# Patient Record
Sex: Male | Born: 1980 | Race: Black or African American | Hispanic: No | Marital: Single | Smoking: Never smoker
Health system: Southern US, Community
[De-identification: ages and names within clinical notes are randomized; demographics above are authoritative.]

---

## 2003-01-14 ENCOUNTER — Emergency Department (HOSPITAL_COMMUNITY): Admission: EM | Admit: 2003-01-14 | Discharge: 2003-01-14 | Payer: Self-pay | Admitting: Emergency Medicine

## 2003-01-14 ENCOUNTER — Encounter: Payer: Self-pay | Admitting: Emergency Medicine

## 2004-12-23 ENCOUNTER — Emergency Department (HOSPITAL_COMMUNITY): Admission: EM | Admit: 2004-12-23 | Discharge: 2004-12-23 | Payer: Self-pay | Admitting: Emergency Medicine

## 2005-10-05 ENCOUNTER — Emergency Department (HOSPITAL_COMMUNITY): Admission: EM | Admit: 2005-10-05 | Discharge: 2005-10-05 | Payer: Self-pay | Admitting: Emergency Medicine

## 2005-10-23 ENCOUNTER — Ambulatory Visit: Payer: Self-pay | Admitting: Nurse Practitioner

## 2006-01-09 ENCOUNTER — Encounter: Admission: RE | Admit: 2006-01-09 | Discharge: 2006-01-09 | Payer: Self-pay | Admitting: Neurology

## 2006-03-22 ENCOUNTER — Ambulatory Visit: Payer: Self-pay | Admitting: Nurse Practitioner

## 2006-08-02 ENCOUNTER — Ambulatory Visit: Payer: Self-pay | Admitting: Nurse Practitioner

## 2008-03-03 ENCOUNTER — Ambulatory Visit (HOSPITAL_COMMUNITY): Payer: Self-pay | Admitting: Psychiatry

## 2008-05-06 ENCOUNTER — Ambulatory Visit (HOSPITAL_COMMUNITY): Payer: Self-pay | Admitting: Psychiatry

## 2008-11-05 ENCOUNTER — Ambulatory Visit (HOSPITAL_COMMUNITY): Payer: Self-pay | Admitting: Psychiatry

## 2009-01-13 ENCOUNTER — Ambulatory Visit (HOSPITAL_COMMUNITY): Payer: Self-pay | Admitting: Psychiatry

## 2009-12-12 ENCOUNTER — Emergency Department (HOSPITAL_BASED_OUTPATIENT_CLINIC_OR_DEPARTMENT_OTHER): Admission: EM | Admit: 2009-12-12 | Discharge: 2009-12-13 | Payer: Self-pay | Admitting: Emergency Medicine

## 2009-12-13 ENCOUNTER — Ambulatory Visit: Payer: Self-pay | Admitting: Radiology

## 2010-07-06 IMAGING — CR DG CHEST 2V
2 series · 2 of 2 positions shown · non-contrast
Comparison: None.

CLINICAL DATA: Dizziness.  Near-syncope.

CHEST - 2 VIEW

[w chest pa]
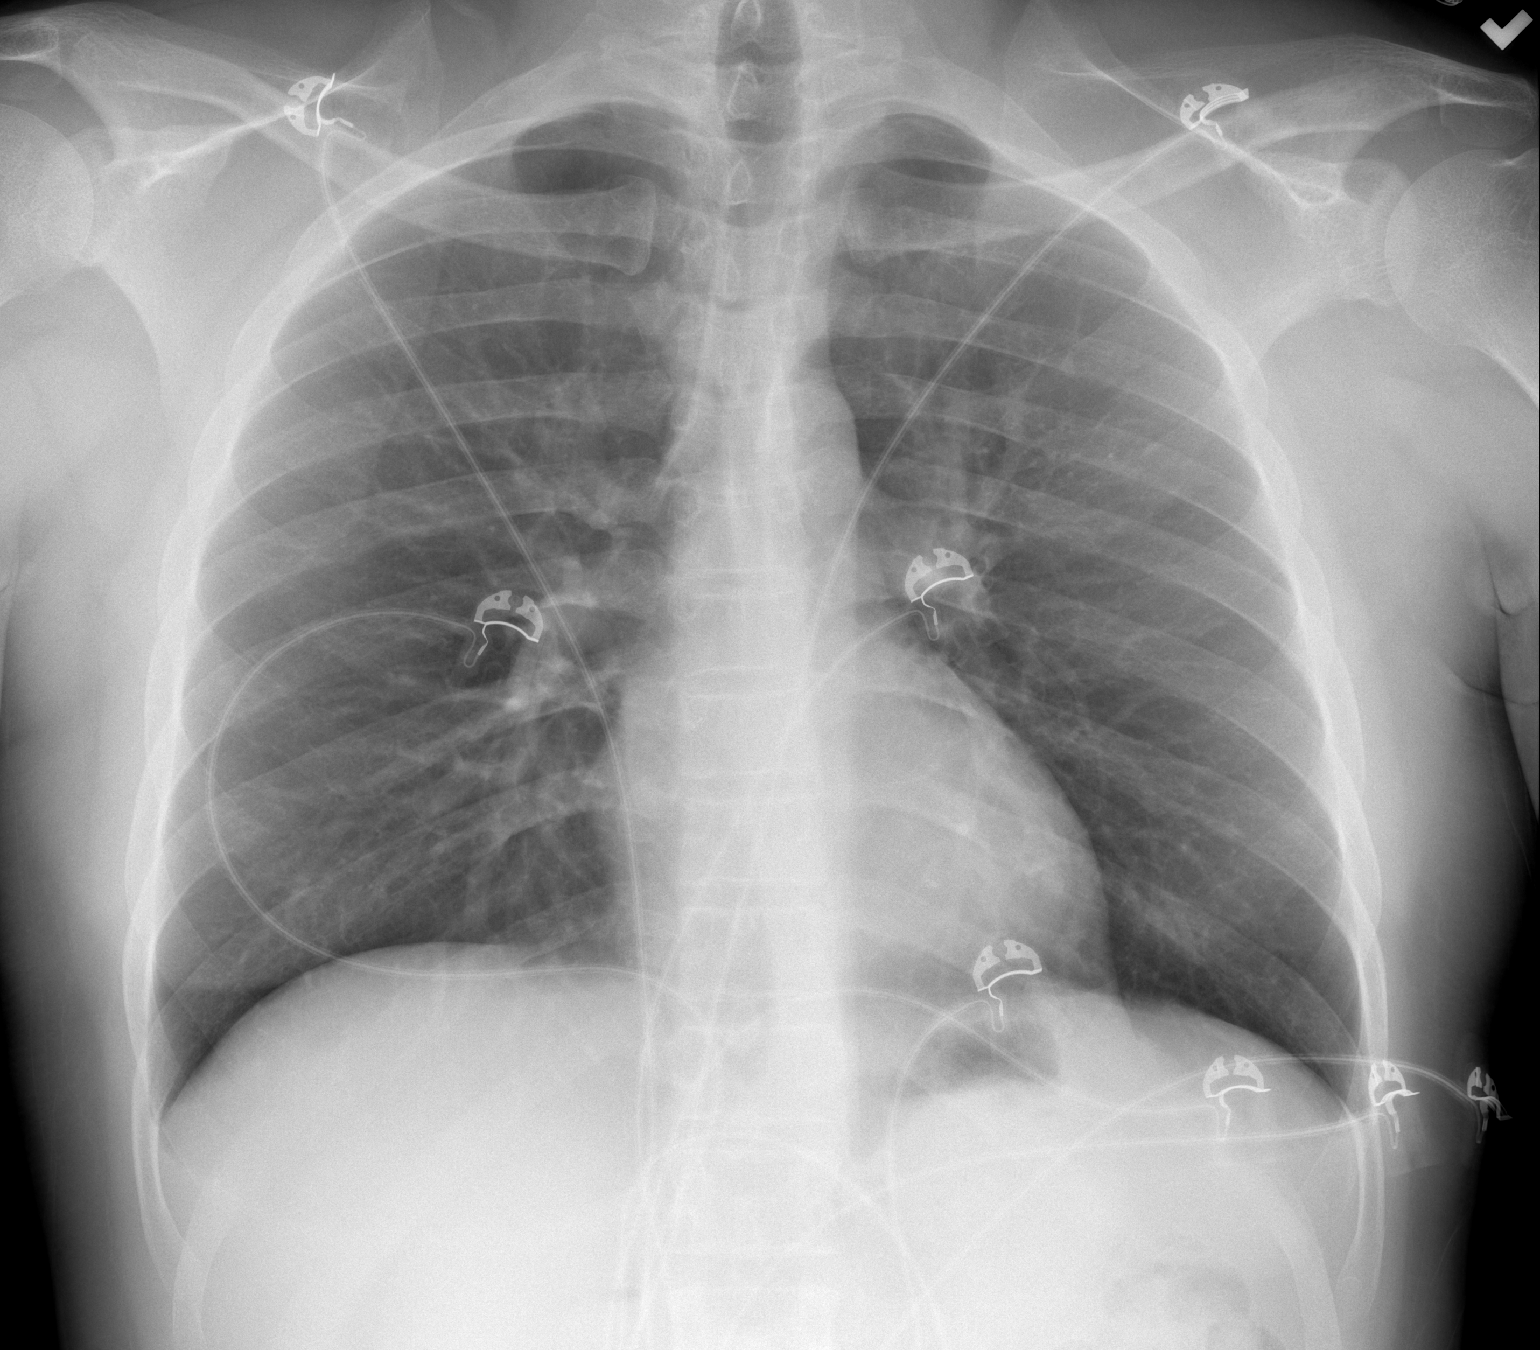

[w chest lat]
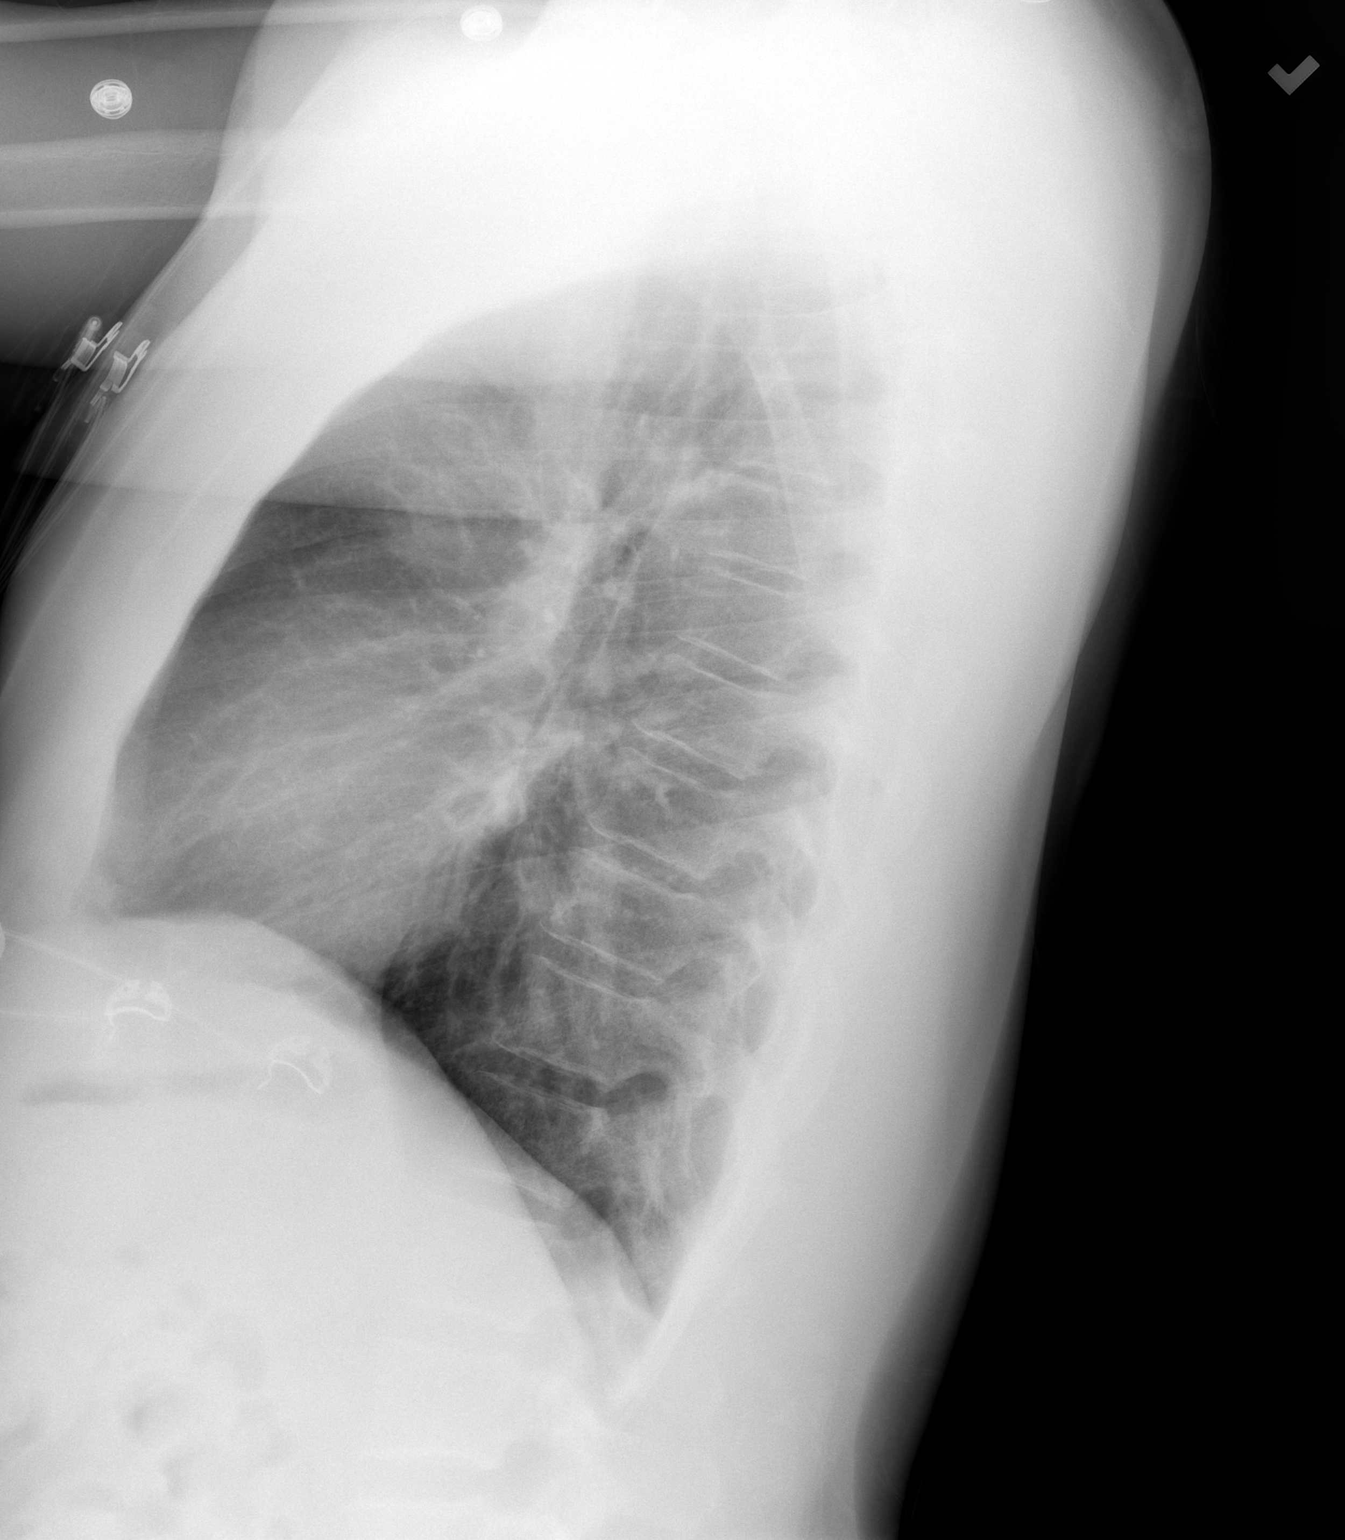

[2 of 2 positions shown; findings below may reference images not displayed]

FINDINGS: The cardiopericardial silhouette is within normal limits
for size.  The lungs are clear.  The visualized soft tissues and
bony thorax are unremarkable.
IMPRESSION: Negative chest.

## 2011-01-12 LAB — CBC
HCT: 44.8 % (ref 39.0–52.0)
Hemoglobin: 15.2 g/dL (ref 13.0–17.0)
MCHC: 34 g/dL (ref 30.0–36.0)
MCV: 88.6 fL (ref 78.0–100.0)
Platelets: 278 10*3/uL (ref 150–400)
RBC: 5.05 MIL/uL (ref 4.22–5.81)
RDW: 12.5 % (ref 11.5–15.5)
WBC: 7.9 10*3/uL (ref 4.0–10.5)

## 2011-01-12 LAB — BASIC METABOLIC PANEL
BUN: 11 mg/dL (ref 6–23)
Chloride: 103 mEq/L (ref 96–112)
Creatinine, Ser: 1.3 mg/dL (ref 0.4–1.5)
Glucose, Bld: 101 mg/dL — ABNORMAL HIGH (ref 70–99)
Potassium: 4.1 mEq/L (ref 3.5–5.1)

## 2011-01-12 LAB — DIFFERENTIAL
Eosinophils Absolute: 0 10*3/uL (ref 0.0–0.7)
Eosinophils Relative: 0 % (ref 0–5)
Lymphs Abs: 1.7 10*3/uL (ref 0.7–4.0)
Monocytes Absolute: 0.7 10*3/uL (ref 0.1–1.0)
Monocytes Relative: 9 % (ref 3–12)
Neutrophils Relative %: 68 % (ref 43–77)

## 2017-06-01 ENCOUNTER — Ambulatory Visit (HOSPITAL_COMMUNITY)
Admission: EM | Admit: 2017-06-01 | Discharge: 2017-06-01 | Disposition: A | Discharge status: Nursing Home (Includes ICF) | Payer: Self-pay

## 2017-06-01 ENCOUNTER — Emergency Department (HOSPITAL_COMMUNITY)
Admission: EM | Admit: 2017-06-01 | Discharge: 2017-06-01 | Disposition: A | Discharge status: Home / Self Care | Payer: Medicaid Other | Attending: Emergency Medicine | Admitting: Emergency Medicine

## 2017-06-01 ENCOUNTER — Encounter (HOSPITAL_COMMUNITY): Payer: Self-pay | Admitting: Emergency Medicine

## 2017-06-01 DIAGNOSIS — T63441A Toxic effect of venom of bees, accidental (unintentional), initial encounter: Secondary | ICD-10-CM | POA: Insufficient documentation

## 2017-06-01 DIAGNOSIS — R2231 Localized swelling, mass and lump, right upper limb: Secondary | ICD-10-CM | POA: Diagnosis not present

## 2017-06-01 MED ORDER — METHYLPREDNISOLONE SODIUM SUCC 125 MG IJ SOLR
125.0000 mg | Freq: Once | INTRAMUSCULAR | Status: AC
  Administered 2017-06-01: 125 mg via INTRAMUSCULAR
  Filled 2017-06-01: qty 2

## 2017-06-01 NOTE — ED Notes (Signed)
Declined W/C at D/C and was escorted to lobby by RN. 

## 2017-06-01 NOTE — ED Provider Notes (Signed)
  [facility]-EMERGENCY DEPT Provider Note   CSN: [id number] Arrival date & time: 06/01/17  1518     History   Chief Complaint Chief Complaint  Patient presents with  . Allergic Reaction    HPI Calvin Rogers is a 36 y.o. male.  The history is provided by the patient. No language interpreter was used.  Allergic Reaction  Presenting symptoms: no difficulty breathing   Severity:  Moderate Duration:  1 day Prior allergic episodes:  No prior episodes Context: insect bite/sting   Relieved by:  Nothing Worsened by:  Nothing Ineffective treatments:  None tried  Pt complains of swelling to his right hand after being stung by a wasp. History reviewed. No pertinent past medical history.  There are no active problems to display for this patient.   History reviewed. No pertinent surgical history.     Home Medications    Prior to Admission medications   Not on File    Family History History reviewed. No pertinent family history.  Social History Social History  Substance Use Topics  . Smoking status: Never Smoker  . Smokeless tobacco: Never Used  . Alcohol use No     Allergies   Patient has no allergy information on record.   Review of Systems Review of Systems  All other systems reviewed and are negative.    Physical Exam Updated Vital Signs BP (!) 141/85 (BP Location: Left Arm)   Pulse 86   Temp 98.6 F (37 C) (Oral)   Resp 18   SpO2 97%   Physical Exam  Constitutional: He appears well-developed and well-nourished.  HENT:  Head: Normocephalic.  Musculoskeletal: He exhibits tenderness.  Swollen  Left hand,  Erythema knuckles,  From  nv and ns intact,  Good color.  Neurological: He is alert.  Skin: Skin is warm. There is erythema.  Psychiatric: He has a normal mood and affect.  Nursing note and vitals reviewed.    ED Treatments / Results  Labs (all labs ordered are listed, but only abnormal results are displayed) Labs Reviewed - No data to  display  EKG  EKG Interpretation None       Radiology No results found.  Procedures Procedures (including critical care time)  Medications Ordered in ED Medications  methylPREDNISolone sodium succinate (SOLU-MEDROL) 125 mg/2 mL injection 125 mg (125 mg Intramuscular Given 06/01/17 1607)     Initial Impression / Assessment and Plan / ED Course  I have reviewed the triage vital signs and the nursing notes.  Pertinent labs & imaging results that were available during my care of the patient were reviewed by me and considered in my medical decision making (see chart for details).     (no sign of compartment syndrome.  Local swelling only)  Final Clinical Impressions(s) / ED Diagnoses   Final diagnoses:  Bee sting, accidental or unintentional, initial encounter    New Prescriptions New Prescriptions   No medications on file  An After Visit Summary was printed and given to the patient.   Elson AreasSofia, Madason Rauls K, [place]PA-C 06/01/17 1647    Doug SouJacubowitz, Sam, MD 06/01/17 [phone]

## 2017-06-01 NOTE — Discharge Instructions (Signed)
Benadryl every 4 hours for itching,  Elevate hand above heart,  hand above elbow.  Ice to area of swelling. Return for recheck if swelling persist

## 2017-06-01 NOTE — ED Triage Notes (Signed)
Pt to ER for evaluation of wasp sting to right hand yesterday. Presents today for swelling, redness, and warmth to right hand.

## 2024-05-06 ENCOUNTER — Other Ambulatory Visit: Payer: Self-pay

## 2024-05-06 ENCOUNTER — Emergency Department (HOSPITAL_BASED_OUTPATIENT_CLINIC_OR_DEPARTMENT_OTHER)
Admission: EM | Admit: 2024-05-06 | Discharge: 2024-05-07 | Disposition: A | Discharge status: Home / Self Care | Payer: Self-pay | Attending: Emergency Medicine | Admitting: Emergency Medicine

## 2024-05-06 ENCOUNTER — Encounter (HOSPITAL_BASED_OUTPATIENT_CLINIC_OR_DEPARTMENT_OTHER): Payer: Self-pay

## 2024-05-06 DIAGNOSIS — Z79899 Other long term (current) drug therapy: Secondary | ICD-10-CM | POA: Diagnosis not present

## 2024-05-06 DIAGNOSIS — I10 Essential (primary) hypertension: Secondary | ICD-10-CM | POA: Diagnosis present

## 2024-05-06 DIAGNOSIS — R030 Elevated blood-pressure reading, without diagnosis of hypertension: Secondary | ICD-10-CM

## 2024-05-06 NOTE — ED Triage Notes (Signed)
 Pt POV for high blood pressures today. Diagnosed with HTN about 3 weeks ago. Prescribed lisinopril/ hydrochlorothiazide once daily, took twice today. Denies CP, HA, SOB.

## 2024-05-07 NOTE — ED Provider Notes (Signed)
 [place] EMERGENCY DEPARTMENT AT [facility] Provider Note  CSN: [id number] Arrival date & time: 05/06/24 2334  Chief Complaint(s) Hypertension  HPI Calvin Rogers is a 43 y.o. male with a past medical history listed below including hypertension here for elevated blood pressure readings up to the systolics of 160s.  Patient has been compliant with his medication.  He was asymptomatic and denied having any headache, chest pain, shortness of breath.  Mother who is his primary caregiver give the patient another dose of his blood pressure medicine in the afternoon and when the glass pressure was still elevated around 11, they decided to come in for evaluation.  The history is provided by the patient.    Past Medical History History reviewed. No pertinent past medical history. There are no active problems to display for this patient.  Home Medication(s) Prior to Admission medications   Medication Sig Start Date End Date Taking? Authorizing Provider  lisinopril-hydrochlorothiazide (ZESTORETIC) 10-12.5 MG tablet Take 1 tablet by mouth daily.   Yes [provider]                                                                                                                                    Allergies Patient has no allergy information on record.  Review of Systems Review of Systems As noted in HPI  Physical Exam Vital Signs  I have reviewed the triage vital signs BP (!) 141/94   Pulse 82   Temp 98.8 F (37.1 C) (Oral)   Resp 20   Ht 5' 9 (1.753 m)   Wt 120.2 kg   SpO2 97%   BMI 39.13 kg/m   Physical Exam Vitals reviewed.  Constitutional:      General: He is not in acute distress.    Appearance: He is well-developed. He is not diaphoretic.  HENT:     Head: Normocephalic and atraumatic.     Nose: Nose normal.  Eyes:     General: No scleral icterus.       Right eye: No discharge.        Left eye: No discharge.     Conjunctiva/sclera: Conjunctivae  normal.     Pupils: Pupils are equal, round, and reactive to light.  Cardiovascular:     Rate and Rhythm: Normal rate and regular rhythm.     Heart sounds: No murmur heard.    No friction rub. No gallop.  Pulmonary:     Effort: Pulmonary effort is normal. No respiratory distress.     Breath sounds: Normal breath sounds. No stridor. No rales.  Abdominal:     General: There is no distension.     Palpations: Abdomen is soft.     Tenderness: There is no abdominal tenderness.  Musculoskeletal:        General: No tenderness.     Cervical back: Normal range of motion and neck supple.  Skin:    General:  Skin is warm and dry.     Findings: No erythema or rash.  Neurological:     Mental Status: He is alert and oriented to person, place, and time.     ED Results and Treatments Labs (all labs ordered are listed, but only abnormal results are displayed) Labs Reviewed - No data to display                                                                                                                       EKG  EKG Interpretation Date/Time:    Ventricular Rate:    PR Interval:    QRS Duration:    QT Interval:    QTC Calculation:   R Axis:      Text Interpretation:         Radiology No results found.  Medications Ordered in ED Medications - No data to display Procedures Procedures  (including critical care time) Medical Decision Making / ED Course   Medical Decision Making   Asymptomatic hypertension.  Blood pressures trending down likely from the second dose of his home medication.  No need for labs or imaging at this time.  Recommended close monitoring of the blood pressure and discussing any uptrending with primary care provider.    Final Clinical Impression(s) / ED Diagnoses Final diagnoses:  Elevated blood pressure reading   The patient appears reasonably screened and/or stabilized for discharge and I doubt any other medical condition or other [facility] requiring further  screening, evaluation, or treatment in the ED at this time. I have discussed the findings, Dx and Tx plan with the patient/family who expressed understanding and agree(s) with the plan. Discharge instructions discussed at length. The patient/family was given strict return precautions who verbalized understanding of the instructions. No further questions at time of discharge.  Disposition: Discharge  Condition: Good  ED Discharge Orders     None         Follow Up: Primary care provider  Schedule an appointment as soon as possible for a visit      This chart was dictated using voice recognition software.  Despite best efforts to proofread,  errors can occur which can change the documentation meaning.    Trine Raynell Moder, MD 05/07/24 [phone]
